# Patient Record
Sex: Female | Born: 1990 | Hispanic: Yes | Marital: Single | Smoking: Never smoker
Health system: Southern US, Community
[De-identification: ages and names within clinical notes are randomized; demographics above are authoritative.]

## PROBLEM LIST (undated history)

## (undated) DIAGNOSIS — G43909 Migraine, unspecified, not intractable, without status migrainosus: Secondary | ICD-10-CM

## (undated) DIAGNOSIS — G43019 Migraine without aura, intractable, without status migrainosus: Secondary | ICD-10-CM

## (undated) DIAGNOSIS — K859 Acute pancreatitis without necrosis or infection, unspecified: Secondary | ICD-10-CM

## (undated) HISTORY — DX: Migraine, unspecified, not intractable, without status migrainosus: G43.909

## (undated) HISTORY — DX: Acute pancreatitis without necrosis or infection, unspecified: K85.90

## (undated) HISTORY — DX: Migraine without aura, intractable, without status migrainosus: G43.019

---
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
## 2012-11-27 ENCOUNTER — Encounter (HOSPITAL_COMMUNITY): Payer: Self-pay | Admitting: *Deleted

## 2012-11-27 ENCOUNTER — Emergency Department (HOSPITAL_COMMUNITY)
Admission: EM | Admit: 2012-11-27 | Discharge: 2012-11-27 | Disposition: A | Discharge status: Home / Self Care | Payer: BC Managed Care – PPO | Attending: Emergency Medicine | Admitting: Emergency Medicine

## 2012-11-27 ENCOUNTER — Emergency Department (HOSPITAL_COMMUNITY): Payer: BC Managed Care – PPO

## 2012-11-27 DIAGNOSIS — Y9389 Activity, other specified: Secondary | ICD-10-CM | POA: Insufficient documentation

## 2012-11-27 DIAGNOSIS — S0003XA Contusion of scalp, initial encounter: Secondary | ICD-10-CM | POA: Insufficient documentation

## 2012-11-27 DIAGNOSIS — S161XXA Strain of muscle, fascia and tendon at neck level, initial encounter: Secondary | ICD-10-CM

## 2012-11-27 DIAGNOSIS — L089 Local infection of the skin and subcutaneous tissue, unspecified: Secondary | ICD-10-CM

## 2012-11-27 DIAGNOSIS — S139XXA Sprain of joints and ligaments of unspecified parts of neck, initial encounter: Secondary | ICD-10-CM | POA: Insufficient documentation

## 2012-11-27 DIAGNOSIS — S1093XA Contusion of unspecified part of neck, initial encounter: Secondary | ICD-10-CM | POA: Insufficient documentation

## 2012-11-27 DIAGNOSIS — IMO0002 Reserved for concepts with insufficient information to code with codable children: Secondary | ICD-10-CM | POA: Insufficient documentation

## 2012-11-27 DIAGNOSIS — Y92410 Unspecified street and highway as the place of occurrence of the external cause: Secondary | ICD-10-CM | POA: Insufficient documentation

## 2012-11-27 MED ORDER — OXYCODONE-ACETAMINOPHEN 5-325 MG PO TABS: 1.0000 | ORAL_TABLET | Freq: Four times a day (QID) | ORAL | Status: AC | PRN

## 2012-11-27 MED ORDER — TETANUS-DIPHTH-ACELL PERTUSSIS 5-2.5-18.5 LF-MCG/0.5 IM SUSP: 0.5000 mL | Freq: Once | INTRAMUSCULAR | Status: DC

## 2012-11-27 MED ORDER — OXYCODONE-ACETAMINOPHEN 5-325 MG PO TABS
2.0000 | ORAL_TABLET | Freq: Once | ORAL | Status: AC
  Administered 2012-11-27: 2 via ORAL
  Filled 2012-11-27: qty 2

## 2012-11-27 NOTE — ED Provider Notes (Signed)
Medical screening examination/treatment/procedure(s) were performed by non-physician practitioner and as supervising physician I was immediately available for consultation/collaboration.  John-Adam Merdis Snodgrass, M.D.     John-Adam Brookelin Felber, MD 11/27/12 0503 
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
## 2012-11-27 NOTE — ED Notes (Signed)
Patient transported to X-ray 
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
## 2012-11-27 NOTE — ED Notes (Signed)
Applied left handed medium long wrist and thumb support splint

## 2013-08-05 ENCOUNTER — Ambulatory Visit (INDEPENDENT_AMBULATORY_CARE_PROVIDER_SITE_OTHER): Payer: BC Managed Care – PPO | Admitting: Family Medicine

## 2013-08-05 VITALS — BP 82/60 | HR 108 | Temp 98.2°F | Resp 28 | Ht 65.75 in | Wt 136.2 lb

## 2013-08-05 DIAGNOSIS — D72829 Elevated white blood cell count, unspecified: Secondary | ICD-10-CM

## 2013-08-05 DIAGNOSIS — G43109 Migraine with aura, not intractable, without status migrainosus: Secondary | ICD-10-CM

## 2013-08-05 DIAGNOSIS — R209 Unspecified disturbances of skin sensation: Secondary | ICD-10-CM

## 2013-08-05 DIAGNOSIS — R112 Nausea with vomiting, unspecified: Secondary | ICD-10-CM

## 2013-08-05 DIAGNOSIS — R202 Paresthesia of skin: Secondary | ICD-10-CM

## 2013-08-05 LAB — POCT CBC
Granulocyte percent: 90.6 % — AB (ref 37–80)
HCT, POC: 48.3 % — AB (ref 37.7–47.9)
Hemoglobin: 15.2 g/dL (ref 12.2–16.2)
Lymph, poc: 0.7 (ref 0.6–3.4)
MCH, POC: 29.6 pg (ref 27–31.2)
MCHC: 31.5 g/dL — AB (ref 31.8–35.4)
MCV: 94.2 fL (ref 80–97)
MID (cbc): 0.5 (ref 0–0.9)
MPV: 8.7 fL (ref 0–99.8)
POC Granulocyte: 11.7 — AB (ref 2–6.9)
POC LYMPH PERCENT: 5.7 %L — AB (ref 10–50)
POC MID %: 3.7 % (ref 0–12)
Platelet Count, POC: 290 10*3/uL (ref 142–424)
RBC: 5.13 M/uL (ref 4.04–5.48)
RDW, POC: 13.1 %
WBC: 12.9 10*3/uL — AB (ref 4.6–10.2)

## 2013-08-05 LAB — POCT GLYCOSYLATED HEMOGLOBIN (HGB A1C): Hemoglobin A1C: 4.9

## 2013-08-05 LAB — GLUCOSE, POCT (MANUAL RESULT ENTRY): POC Glucose: 109 mg/dl — AB (ref 70–99)

## 2013-08-05 MED ORDER — HYDROCODONE-ACETAMINOPHEN 5-300 MG PO TABS: 1.0000 | ORAL_TABLET | Freq: Three times a day (TID) | ORAL | Status: AC | PRN

## 2013-08-05 MED ORDER — PROMETHAZINE HCL 25 MG PO TABS: 25.0000 mg | ORAL_TABLET | Freq: Four times a day (QID) | ORAL | Status: AC | PRN

## 2013-08-05 MED ORDER — PROMETHAZINE HCL 25 MG/ML IJ SOLN: 25.0000 mg | Freq: Four times a day (QID) | INTRAMUSCULAR | Status: DC | PRN

## 2013-08-05 MED ORDER — PROMETHAZINE HCL 25 MG/ML IJ SOLN
25.0000 mg | Freq: Once | INTRAMUSCULAR | Status: AC
  Administered 2013-08-05: 25 mg via INTRAMUSCULAR

## 2013-08-05 NOTE — Progress Notes (Signed)
Chief Complaint:  Chief Complaint  Patient presents with  . Emesis    30-40 times today since 7:30 this morning    HPI: Courtney Logan is a 23 y.o. female who is here for  Migraine headache associated  with noise and light sensitivity and has been unable to keep anything down since this AM. She had a HA last night and thought she could sleep it off. No neuor deficits except that for the last couple of weeks she has had numbness in one of her great toes. She has not been able to tolerate any fluids or food. Denies rash, diarrhea, abd pain. She denies vision changes,  confuision. She has felt light headed. She has a rx for promethazine but was unable to find it. She has NSAID allergy so can;t take ibuprofen. She has daily migaines which have not been worked up. She has had migraines since age 9. She usually is able to tune them out.  No illicit drugs, foods, new meds, rashes, travels. Deneis any neck rigidity, she just started having a little back pain and neck pain because she has been in bed all day. Marland Kitchen Her body does feels like it is having " an out of body experience" , she denies flu like sxs. Has not had flu vaccine. Not the worst HA of her life. It is a throbbing HA , diffuse. She has not tried anything for it, states due to her allergies she is able to only take tylenol and that does not help. SHe has ahd promethazine and vicodin in the past for this. She has had abut 30-40 spells of dry heaving and vomiting. She denies diarrhea or abd pain. Mom is here with her and wants her lipase checked as well sicne she had elevated lipase whne she had her GB sxs , I tried to reassure her that it was related to GB and this is probably different.   She lives at home, is a Holiday representative at [employer]. Family is from [place]  Cousin with severe migraines  Past Medical History  Diagnosis Date  . Migraine    Past Surgical History  Procedure Laterality Date  .  Cholecystectomy  08/26/11  . Cholecystectomy  08/26/2011    Procedure: LAPAROSCOPIC CHOLECYSTECTOMY WITH INTRAOPERATIVE CHOLANGIOGRAM;  Surgeon: Robyne Askew, MD;  Location: [facility];  Service: General;  Laterality: N/A;  with attempted cholangiogram   History   Social History  . Marital Status: Single    Spouse Name: N/A    Number of Children: N/A  . Years of Education: N/A   Social History Main Topics  . Smoking status: Never Smoker   . Smokeless tobacco: None  . Alcohol Use: No  . Drug Use: No  . Sexual Activity:    Other Topics Concern  . None   Social History Narrative  . None   Family History  Problem Relation Age of Onset  . Cancer Maternal Grandmother     pancreatic  . Cancer Paternal Grandfather     prostate   Allergies  Allergen Reactions  . Aspirin Anaphylaxis and Swelling    Eye swelling  . Nsaids Anaphylaxis and Swelling  . Contrast Media [Iodinated Diagnostic Agents]     Hives presented post CT contrast 08/22/11  . Dilaudid [Hydromorphone Hcl]     Hives presented post dilaudid IV 08/22/11   Prior to Admission medications   Medication Sig Start Date End Date Taking? Authorizing  Provider  ondansetron (ZOFRAN) 4 MG tablet Take 4 mg by mouth every 8 (eight) hours as needed for nausea.    Historical Provider, MD  oxyCODONE-acetaminophen (PERCOCET/ROXICET) 5-325 MG per tablet Take 1 tablet by mouth every 6 (six) hours as needed for pain. 11/27/12   Arman Filter, NP  promethazine (PHENERGAN) 12.5 MG tablet Take 1 tablet (12.5 mg total) by mouth every 6 (six) hours as needed for nausea. 08/30/11 09/06/11  Richarda Overlie, MD  promethazine (PHENERGAN) 25 MG tablet Take 25 mg by mouth every 6 (six) hours as needed for nausea.    Historical Provider, MD     ROS: The patient denies fevers, chills, night sweats, unintentional weight loss, chest pain, palpitations, wheezing, dyspnea on exertion, nausea, vomiting, abdominal pain, dysuria, hematuria, melena  All other systems  have been reviewed and were otherwise negative with the exception of those mentioned in the HPI and as above.    PHYSICAL EXAM: Filed Vitals:   08/05/13 1837  BP: 82/60  Pulse: 108  Temp: 98.2 F (36.8 C)  Resp: 28  Repeat Resp 15 Spo2 99% Filed Vitals:   08/05/13 1837  Height: 5' 5.75" (1.67 m)  Weight: 136 lb 3.2 oz (61.78 kg)   Body mass index is 22.15 kg/(m^2).  General: Alert, mild acute distress HEENT:  Normocephalic, atraumatic, oropharynx patent. EOMI, PERRLA, fundoscopic exam nl, + dry oral mucosa Cardiovascular: Tachycardia Regular rate and rhythm, no rubs murmurs or gallops.  No Carotid bruits, radial pulse intact. No pedal edema.  Respiratory: Clear to auscultation bilaterally.  No wheezes, rales, or rhonchi.  No cyanosis, no use of accessory musculature GI: No organomegaly, abdomen is soft and non-tender, positive bowel sounds.  No masses. Skin: No rashes. Neurologic: Facial musculature symmetric. CN 2-12 grossly nl. No menigneal signs Psychiatric: Patient is appropriate throughout our interaction. Lymphatic: No cervical lymphadenopathy Musculoskeletal: Gait intact. Neck ROM nl 5/5 strength, 2/2 DTR Sensation intact   LABS: Results for orders placed in visit on 08/05/13  POCT CBC      Result Value Range   WBC 12.9 (*) 4.6 - 10.2 K/uL   Lymph, poc 0.7  0.6 - 3.4   POC LYMPH PERCENT 5.7 (*) 10 - 50 %L   MID (cbc) 0.5  0 - 0.9   POC MID % 3.7  0 - 12 %M   POC Granulocyte 11.7 (*) 2 - 6.9   Granulocyte percent 90.6 (*) 37 - 80 %G   RBC 5.13  4.04 - 5.48 M/uL   Hemoglobin 15.2  12.2 - 16.2 g/dL   HCT, POC 16.1 (*) 09.6 - 47.9 %   MCV 94.2  80 - 97 fL   MCH, POC 29.6  27 - 31.2 pg   MCHC 31.5 (*) 31.8 - 35.4 g/dL   RDW, POC 04.5     Platelet Count, POC 290  142 - 424 K/uL   MPV 8.7  0 - 99.8 fL  GLUCOSE, POCT (MANUAL RESULT ENTRY)      Result Value Range   POC Glucose 109 (*) 70 - 99 mg/dl  POCT GLYCOSYLATED HEMOGLOBIN (HGB A1C)      Result Value Range    Hemoglobin A1C 4.9       EKG/XRAY:   Primary read interpreted by Dr. Conley Rolls at [facility].   ASSESSMENT/PLAN: Encounter Diagnoses  Name Primary?  . Nausea with vomiting   . Paresthesia   . Migraine headache with aura Yes  . Leukocytosis, unspecified    Return in  2 days fro repeat CBC, elevated white count is reactive? This is not the worst HA of her life She received 2 L IVF and Zofran 8 mg ODT in office and was better after 1st L IVF  then felt that HA was still present so received Promethazine 25 mg IM x 1  Rx promethazine 25 mg for home prn Rx Lortab 5/300 mg for home prn since this has helped her in the past, we did discuss rebound HA with narcotics.  Not the worse HA of her life, parents decline to go to ER, she has NSAID allergies so unable to give tylenol. They will go to ER as needed.  Consider further imaging study if no improvement F/u in AM by phone Refer to HA clinic for chronic migraines  Gross sideeffects, risk and benefits, and alternatives of medications d/w patient. Patient is aware that all medications have potential sideeffects and we are unable to predict every sideeffect or drug-drug interaction that may occur.  Harriette Tovey PHUONG, DO 08/05/2013 9:10 PM

## 2013-08-05 NOTE — Patient Instructions (Signed)
Migraine Headache A migraine headache is an intense, throbbing pain on one or both sides of your head. A migraine can last for 30 minutes to several hours. CAUSES  The exact cause of a migraine headache is not always known. However, a migraine may be caused when nerves in the brain become irritated and release chemicals that cause inflammation. This causes pain. Certain things may also trigger migraines, such as:  Alcohol.  Smoking.  Stress.  Menstruation.  Aged cheeses.  Foods or drinks that contain nitrates, glutamate, aspartame, or tyramine.  Lack of sleep.  Chocolate.  Caffeine.  Hunger.  Physical exertion.  Fatigue.  Medicines used to treat chest pain (nitroglycerine), birth control pills, estrogen, and some blood pressure medicines. SIGNS AND SYMPTOMS  Pain on one or both sides of your head.  Pulsating or throbbing pain.  Severe pain that prevents daily activities.  Pain that is aggravated by any physical activity.  Nausea, vomiting, or both.  Dizziness.  Pain with exposure to bright lights, loud noises, or activity.  General sensitivity to bright lights, loud noises, or smells. Before you get a migraine, you may get warning signs that a migraine is coming (aura). An aura may include:  Seeing flashing lights.  Seeing bright spots, halos, or zig-zag lines.  Having tunnel vision or blurred vision.  Having feelings of numbness or tingling.  Having trouble talking.  Having muscle weakness. DIAGNOSIS  A migraine headache is often diagnosed based on:  Symptoms.  Physical exam.  A CT scan or MRI of your head. These imaging tests cannot diagnose migraines, but they can help rule out other causes of headaches. TREATMENT Medicines may be given for pain and nausea. Medicines can also be given to help prevent recurrent migraines.  HOME CARE INSTRUCTIONS  Only take over-the-counter or prescription medicines for pain or discomfort as directed by your  health care provider. The use of long-term narcotics is not recommended.  Lie down in a dark, quiet room when you have a migraine.  Keep a journal to find out what may trigger your migraine headaches. For example, write down:  What you eat and drink.  How much sleep you get.  Any change to your diet or medicines.  Limit alcohol consumption.  Quit smoking if you smoke.  Get 7 9 hours of sleep, or as recommended by your health care provider.  Limit stress.  Keep lights dim if bright lights bother you and make your migraines worse. SEEK IMMEDIATE MEDICAL CARE IF:   Your migraine becomes severe.  You have a fever.  You have a stiff neck.  You have vision loss.  You have muscular weakness or loss of muscle control.  You start losing your balance or have trouble walking.  You feel faint or pass out.  You have severe symptoms that are different from your first symptoms. MAKE SURE YOU:   Understand these instructions.  Will watch your condition.  Will get help right away if you are not doing well or get worse. Document Released: 07/01/2005 Document Revised: 04/21/2013 Document Reviewed: 03/08/2013 Exit[employer] Patient Information 2014 [employer].  

## 2013-08-06 ENCOUNTER — Other Ambulatory Visit: Payer: Self-pay | Admitting: Family Medicine

## 2013-08-06 ENCOUNTER — Telehealth: Payer: Self-pay | Admitting: Family Medicine

## 2013-08-06 DIAGNOSIS — R51 Headache: Secondary | ICD-10-CM

## 2013-08-06 LAB — COMPREHENSIVE METABOLIC PANEL WITH GFR
ALT: 25 U/L (ref 0–35)
AST: 24 U/L (ref 0–37)
Albumin: 4.3 g/dL (ref 3.5–5.2)
Calcium: 9.1 mg/dL (ref 8.4–10.5)
Chloride: 103 meq/L (ref 96–112)
Potassium: 4.1 meq/L (ref 3.5–5.3)

## 2013-08-06 LAB — COMPREHENSIVE METABOLIC PANEL
Alkaline Phosphatase: 57 U/L (ref 39–117)
BUN: 17 mg/dL (ref 6–23)
CO2: 19 mEq/L (ref 19–32)
Creat: 0.85 mg/dL (ref 0.50–1.10)
Glucose, Bld: 121 mg/dL — ABNORMAL HIGH (ref 70–99)
Sodium: 135 mEq/L (ref 135–145)
Total Bilirubin: 0.6 mg/dL (ref 0.3–1.2)
Total Protein: 7.4 g/dL (ref 6.0–8.3)

## 2013-08-06 LAB — LIPASE: Lipase: <10 U/L (ref 0–75)

## 2013-08-06 NOTE — Telephone Encounter (Signed)
LM to check on her to see how she is doing, not all her labs have come back yet so will call her with that when they are all back. Mobile phone is wrong number, home phone is correct

## 2013-08-07 ENCOUNTER — Other Ambulatory Visit (INDEPENDENT_AMBULATORY_CARE_PROVIDER_SITE_OTHER): Payer: BC Managed Care – PPO

## 2013-08-07 DIAGNOSIS — D72829 Elevated white blood cell count, unspecified: Secondary | ICD-10-CM

## 2013-08-07 LAB — POCT CBC
Granulocyte percent: 58.7 % (ref 37–80)
HCT, POC: 44.2 % (ref 37.7–47.9)
Hemoglobin: 14 g/dL (ref 12.2–16.2)
Lymph, poc: 1.7 (ref 0.6–3.4)
MCH, POC: 29.7 pg (ref 27–31.2)
MCHC: 31.7 g/dL — AB (ref 31.8–35.4)
MCV: 93.8 fL (ref 80–97)
MID (cbc): 0.4 (ref 0–0.9)
MPV: 8.4 fL (ref 0–99.8)
POC Granulocyte: 2.9 (ref 2–6.9)
POC LYMPH PERCENT: 33.9 %L (ref 10–50)
POC MID %: 7.4 % (ref 0–12)
Platelet Count, POC: 231 10*3/uL (ref 142–424)
RBC: 4.71 M/uL (ref 4.04–5.48)
RDW, POC: 12.2 %
WBC: 5 10*3/uL (ref 4.6–10.2)

## 2013-08-26 ENCOUNTER — Encounter: Payer: Self-pay | Admitting: Neurology

## 2013-08-26 ENCOUNTER — Ambulatory Visit (INDEPENDENT_AMBULATORY_CARE_PROVIDER_SITE_OTHER): Payer: BC Managed Care – PPO | Admitting: Neurology

## 2013-08-26 VITALS — BP 132/78 | HR 98 | Ht 65.0 in | Wt 137.0 lb

## 2013-08-26 DIAGNOSIS — G43019 Migraine without aura, intractable, without status migrainosus: Secondary | ICD-10-CM

## 2013-08-26 HISTORY — DX: Migraine without aura, intractable, without status migrainosus: G43.019

## 2013-08-26 MED ORDER — TOPIRAMATE 25 MG PO TABS: ORAL_TABLET | ORAL | Status: AC

## 2013-08-26 NOTE — Progress Notes (Signed)
Reason for visit: Headache  Courtney Logan is a 23 y.o. female  History of present illness:  Ms. Courtney Logan is a 23 year old right-handed patient with a history of headaches dating back to age 31. The patient has had virtually daily headaches over the last 6 years, indicating that the headaches are generally bifrontal and temporal in nature. The patient denies any occipital headaches or neck stiffness with the headache. The patient usually does not get any nausea or vomiting, but within the last 2 weeks, the headaches have become more severe, and nausea and vomiting have been a problem. The patient may have blurring of vision as well. The patient reports some tingling sensations in the feet and hands with the nausea. The patient denies any weakness of the extremities or problems with balance or difficulty with bowel or bladder control. The patient indicates that she may have some slight confusion with the headache. Food such as strawberries, orange juice, eggs, and bananas may bring on the headache. The patient indicates that strong odors or perfumes or rapid head movements may induce the headache. The patient indicates that she has had a CT scan of the head in the past. The patient comes to this office for an evaluation.  Past Medical History  Diagnosis Date  . Migraine   . Pancreatitis   . Migraine without aura, with intractable migraine, so stated, without mention of status migrainosus 08/26/2013    Past Surgical History  Procedure Laterality Date  . Cholecystectomy  08/26/2011    Procedure: LAPAROSCOPIC CHOLECYSTECTOMY WITH INTRAOPERATIVE CHOLANGIOGRAM;  Surgeon: Robyne Askew, MD;  Location: [facility];  Service: General;  Laterality: N/A;  with attempted cholangiogram    Family History  Problem Relation Age of Onset  . Cancer Maternal Grandmother     pancreatic  . Cancer Paternal Grandfather     prostate  . Migraines Mother     Social history:  reports that she has  never smoked. She does not have any smokeless tobacco history on file. She reports that she does not drink alcohol or use illicit drugs.  Medications:  Current Outpatient Prescriptions on File Prior to Visit  Medication Sig Dispense Refill  . Hydrocodone-Acetaminophen 5-300 MG TABS Take 1 tablet by mouth every 8 (eight) hours as needed.  20 each  0  . ondansetron (ZOFRAN) 4 MG tablet Take 4 mg by mouth every 8 (eight) hours as needed for nausea.      Marland Kitchen oxyCODONE-acetaminophen (PERCOCET/ROXICET) 5-325 MG per tablet Take 1 tablet by mouth every 6 (six) hours as needed for pain.  12 tablet  0  . promethazine (PHENERGAN) 25 MG tablet Take 1 tablet (25 mg total) by mouth every 6 (six) hours as needed for nausea.  30 tablet  0   No current facility-administered medications on file prior to visit.      Allergies  Allergen Reactions  . Aspirin Anaphylaxis and Swelling    Eye swelling  . Nsaids Anaphylaxis and Swelling  . Contrast Media [Iodinated Diagnostic Agents]     Hives presented post CT contrast 08/22/11  . Dilaudid [Hydromorphone Hcl]     Hives presented post dilaudid IV 08/22/11    ROS:  Out of a complete 14 system review of symptoms, the patient complains only of the following symptoms, and all other reviewed systems are negative.  Ringing in the ears Itching Blurred vision Snoring Constipation Urination problems Increased thirst Achy muscles Headache, numbness, weakness, dizziness Insomnia  Blood pressure 132/78,  pulse 98, height 5\' 5"  (1.651 m), weight 137 lb (62.143 kg).  Physical Exam  General: The patient is alert and cooperative at the time of the examination.  Eyes: Pupils are equal, round, and reactive to light. Discs are flat bilaterally.  Neck: The neck is supple, no carotid bruits are noted.  Respiratory: The respiratory examination is clear.  Cardiovascular: The cardiovascular examination reveals a regular rate and rhythm, no obvious murmurs or rubs are  noted.  Neuromuscular: The patient has good range of movement of the cervical spine. Some mild crepitus is noted in the temporomandibular joints bilaterally.  Skin: Extremities are without significant edema.  Neurologic Exam  Mental status: The patient is alert and oriented x 3 at the time of the examination. The patient has apparent normal recent and remote memory, with an apparently normal attention span and concentration ability.  Cranial nerves: Facial symmetry is present. There is good sensation of the face to pinprick and soft touch bilaterally. The strength of the facial muscles and the muscles to head turning and shoulder shrug are normal bilaterally. Speech is well enunciated, no aphasia or dysarthria is noted. Extraocular movements are full. Visual fields are full. The tongue is midline, and the patient has symmetric elevation of the soft palate. No obvious hearing deficits are noted.  Motor: The motor testing reveals 5 over 5 strength of all 4 extremities. Good symmetric motor tone is noted throughout.  Sensory: Sensory testing is intact to pinprick, soft touch, vibration sensation, and position sense on all 4 extremities. No evidence of extinction is noted.  Coordination: Cerebellar testing reveals good finger-nose-finger and heel-to-shin bilaterally.  Gait and station: Gait is normal. Tandem gait is normal. Romberg is negative. No drift is seen.  Reflexes: Deep tendon reflexes are symmetric and normal bilaterally. Toes are downgoing bilaterally.   CT the head 11/27/2012:  IMPRESSION:  No acute intracranial abnormality    Assessment/Plan:  1. Intractable migraine  The patient is having daily headaches over the last 6 years. The patient has never taken a daily medication to help prevent the headache. The patient generally does not take in medications when she gets a headache, she will occasionally take a nap. The patient will be placed on low-dose Topamax, and she will  followup in 3-4 months. The patient will contact our office if she has issues with the medication.   Marlan Palau. Keith Eliot Popper MD 08/26/2013 7:29 PM  [facility] [address] [place] [id number]  Phone [phone] Fax [phone]

## 2013-08-26 NOTE — Patient Instructions (Signed)
Topamax (topiramate) is a seizure medication that has an FDA approval for seizures and for migraine headache. Potential side effects of this medication include weight loss, cognitive slowing, tingling in the fingers and toes, and carbonated drinks will taste bad. If any significant side effects are noted on this drug, please contact our office.     Migraine Headache A migraine headache is an intense, throbbing pain on one or both sides of your head. A migraine can last for 30 minutes to several hours. CAUSES  The exact cause of a migraine headache is not always known. However, a migraine may be caused when nerves in the brain become irritated and release chemicals that cause inflammation. This causes pain. Certain things may also trigger migraines, such as:  Alcohol.  Smoking.  Stress.  Menstruation.  Aged cheeses.  Foods or drinks that contain nitrates, glutamate, aspartame, or tyramine.  Lack of sleep.  Chocolate.  Caffeine.  Hunger.  Physical exertion.  Fatigue.  Medicines used to treat chest pain (nitroglycerine), birth control pills, estrogen, and some blood pressure medicines. SIGNS AND SYMPTOMS  Pain on one or both sides of your head.  Pulsating or throbbing pain.  Severe pain that prevents daily activities.  Pain that is aggravated by any physical activity.  Nausea, vomiting, or both.  Dizziness.  Pain with exposure to bright lights, loud noises, or activity.  General sensitivity to bright lights, loud noises, or smells. Before you get a migraine, you may get warning signs that a migraine is coming (aura). An aura may include:  Seeing flashing lights.  Seeing bright spots, halos, or zig-zag lines.  Having tunnel vision or blurred vision.  Having feelings of numbness or tingling.  Having trouble talking.  Having muscle weakness. DIAGNOSIS  A migraine headache is often diagnosed based on:  Symptoms.  Physical exam.  A CT scan or MRI of  your head. These imaging tests cannot diagnose migraines, but they can help rule out other causes of headaches. TREATMENT Medicines may be given for pain and nausea. Medicines can also be given to help prevent recurrent migraines.  HOME CARE INSTRUCTIONS  Only take over-the-counter or prescription medicines for pain or discomfort as directed by your health care provider. The use of long-term narcotics is not recommended.  Lie down in a dark, quiet room when you have a migraine.  Keep a journal to find out what may trigger your migraine headaches. For example, write down:  What you eat and drink.  How much sleep you get.  Any change to your diet or medicines.  Limit alcohol consumption.  Quit smoking if you smoke.  Get 7 9 hours of sleep, or as recommended by your health care provider.  Limit stress.  Keep lights dim if bright lights bother you and make your migraines worse. SEEK IMMEDIATE MEDICAL CARE IF:   Your migraine becomes severe.  You have a fever.  You have a stiff neck.  You have vision loss.  You have muscular weakness or loss of muscle control.  You start losing your balance or have trouble walking.  You feel faint or pass out.  You have severe symptoms that are different from your first symptoms. MAKE SURE YOU:   Understand these instructions.  Will watch your condition.  Will get help right away if you are not doing well or get worse. Document Released: 07/01/2005 Document Revised: 04/21/2013 Document Reviewed: 03/08/2013 [employer] Patient Information 2014 [employer].  
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
## 2013-11-24 ENCOUNTER — Encounter: Payer: Self-pay | Admitting: Nurse Practitioner

## 2013-12-30 ENCOUNTER — Ambulatory Visit: Payer: BC Managed Care – PPO | Admitting: Nurse Practitioner

## 2014-02-23 ENCOUNTER — Telehealth: Payer: Self-pay | Admitting: *Deleted

## 2014-02-23 NOTE — Telephone Encounter (Signed)
Calling patient to r/s appointment, no answer left voice message to call back and r/s appointment on 03/14/14 with NP MM. CM never seen patient before.

## 2014-03-14 ENCOUNTER — Ambulatory Visit: Payer: Self-pay | Admitting: Adult Health
